# Patient Record
Sex: Male | Born: 1970 | Race: Black or African American | Hispanic: No | Marital: Single | Smoking: Never smoker
Health system: Southern US, Community
[De-identification: ages and names within clinical notes are randomized; demographics above are authoritative.]

## PROBLEM LIST (undated history)

## (undated) DIAGNOSIS — E7800 Pure hypercholesterolemia, unspecified: Secondary | ICD-10-CM

---

## 2014-06-20 ENCOUNTER — Encounter (HOSPITAL_COMMUNITY): Payer: Self-pay | Admitting: Emergency Medicine

## 2014-06-20 ENCOUNTER — Emergency Department (HOSPITAL_COMMUNITY): Payer: Self-pay

## 2014-06-20 ENCOUNTER — Emergency Department (HOSPITAL_COMMUNITY)
Admission: EM | Admit: 2014-06-20 | Discharge: 2014-06-20 | Disposition: A | Discharge status: Home / Self Care | Payer: Self-pay | Attending: Emergency Medicine | Admitting: Emergency Medicine

## 2014-06-20 DIAGNOSIS — R0789 Other chest pain: Secondary | ICD-10-CM | POA: Insufficient documentation

## 2014-06-20 DIAGNOSIS — Z79899 Other long term (current) drug therapy: Secondary | ICD-10-CM | POA: Insufficient documentation

## 2014-06-20 DIAGNOSIS — E7800 Pure hypercholesterolemia, unspecified: Secondary | ICD-10-CM | POA: Insufficient documentation

## 2014-06-20 HISTORY — DX: Pure hypercholesterolemia, unspecified: E78.00

## 2014-06-20 LAB — BASIC METABOLIC PANEL
BUN: 9 mg/dL (ref 6–23)
CO2: 29 mEq/L (ref 19–32)
Calcium: 9.5 mg/dL (ref 8.4–10.5)
Chloride: 101 mEq/L (ref 96–112)
Creatinine, Ser: 0.96 mg/dL (ref 0.50–1.35)
GFR calc Af Amer: >90 mL/min (ref >90)
GFR calc non Af Amer: >90 mL/min (ref >90)
Glucose, Bld: 93 mg/dL (ref 70–99)
Potassium: 4.2 mEq/L (ref 3.7–5.3)
Sodium: 139 mEq/L (ref 137–147)

## 2014-06-20 LAB — CBC
HCT: 42.6 % (ref 39.0–52.0)
Hemoglobin: 15 g/dL (ref 13.0–17.0)
MCH: 31.4 pg (ref 26.0–34.0)
MCHC: 35.2 g/dL (ref 30.0–36.0)
MCV: 89.3 fL (ref 78.0–100.0)
Platelets: 250 10*3/uL (ref 150–400)
RBC: 4.77 MIL/uL (ref 4.22–5.81)
RDW: 12.6 % (ref 11.5–15.5)
WBC: 5.4 10*3/uL (ref 4.0–10.5)

## 2014-06-20 LAB — I-STAT TROPONIN, ED: Troponin i, poc: 0 ng/mL (ref 0.00–0.08)

## 2014-06-20 MED ORDER — IBUPROFEN 800 MG PO TABS: 800.0000 mg | ORAL_TABLET | Freq: Three times a day (TID) | ORAL | Status: AC | PRN

## 2014-06-20 NOTE — Discharge Instructions (Signed)
Followup with Felicia about obtaining resources for followup.  Return here as needed

## 2014-06-20 NOTE — Discharge Planning (Signed)
[facility] Community Liaison  Spoke to patient about follow up care and establishing care with a provider. Patient states he is looking to go full time with his current employer and obtain insurance through the company. Patient was given the orange card application and primary care resource guide. My contact information was also provided for any future questions or concerns.
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
## 2014-06-20 NOTE — ED Notes (Signed)
Pt reports mid chest tightness x 5 days. Had mild cough and sob recently. No acute distress noted, ekg done at triage.

## 2014-06-22 NOTE — ED Provider Notes (Signed)
Medical screening examination/treatment/procedure(s) were performed by non-physician practitioner and as supervising physician I was immediately available for consultation/collaboration.   EKG Interpretation   Date/Time:  Wednesday June 20 2014 08:13:30 EDT Ventricular Rate:  59 PR Interval:  146 QRS Duration: 84 QT Interval:  414 QTC Calculation: 409 R Axis:   49 Text Interpretation:  Sinus bradycardia Otherwise normal ECG No old  tracing to compare Confirmed by [facility]  MD, Nicholos JohnsKATHLEEN [phone]([id number]) on  06/20/2014 8:38:32 AM        Laray AngerKathleen M McManus, DO 06/22/14 [phone]

## 2015-12-26 ENCOUNTER — Emergency Department (HOSPITAL_COMMUNITY)
Admission: EM | Admit: 2015-12-26 | Discharge: 2015-12-26 | Disposition: A | Discharge status: Home / Self Care | Payer: 59 | Attending: Physician Assistant | Admitting: Physician Assistant

## 2015-12-26 ENCOUNTER — Encounter (HOSPITAL_COMMUNITY): Payer: Self-pay | Admitting: Emergency Medicine

## 2015-12-26 DIAGNOSIS — L299 Pruritus, unspecified: Secondary | ICD-10-CM | POA: Insufficient documentation

## 2015-12-26 DIAGNOSIS — Z8639 Personal history of other endocrine, nutritional and metabolic disease: Secondary | ICD-10-CM | POA: Insufficient documentation

## 2015-12-26 MED ORDER — PREDNISONE 20 MG PO TABS
60.0000 mg | ORAL_TABLET | Freq: Once | ORAL | Status: AC
  Administered 2015-12-26: 60 mg via ORAL
  Filled 2015-12-26: qty 3

## 2015-12-26 MED ORDER — PREDNISONE 10 MG (21) PO TBPK: 10.0000 mg | ORAL_TABLET | Freq: Every day | ORAL | Status: AC

## 2015-12-26 NOTE — Discharge Instructions (Signed)
[name],  Nice meeting you! Please follow-up with your primary care provider or a dermatologist as needed. Return to the emergency department if you develop fevers, chills, shortness of breath, tongue itching/numbness. Feel better soon!  S. Lane Hacker, PA-C   Pruritus Pruritus is an itching feeling. There are many different conditions and factors that can make your skin itchy. Dry skin is one of the most common causes of itching. Most cases of itching do not require medical attention. Itchy skin can turn into a rash.  HOME CARE INSTRUCTIONS  Watch your pruritus for any changes. Take these steps to help with your condition:  Skin Care  Moisturize your skin as needed. A moisturizer that contains petroleum jelly is best for keeping moisture in your skin.  Take or apply medicines only as directed by your health care provider. This may include:  Corticosteroid cream.  Anti-itch lotions.  Oral anti-histamines.  Apply cool compresses to the affected areas.  Try taking a bath with:  Epsom salts. Follow the instructions on the packaging. You can get these at your local pharmacy or grocery store.  Baking soda. Pour a small amount into the bath as directed by your health care provider.  Colloidal oatmeal. Follow the instructions on the packaging. You can get this at your local pharmacy or grocery store.  Try applying baking soda paste to your skin. Stir water into baking soda until it reaches a paste-like consistency.   Do not scratch your skin.  Avoid hot showers or baths, which can make itching worse. A cold shower may help with itching as long as you use a moisturizer after.  Avoid scented soaps, detergents, and perfumes. Use gentle soaps, detergents, perfumes, and other cosmetic products. General Instructions  Avoid wearing tight clothes.  Keep a journal to help track what causes your itch. Write down:  What you eat.  What cosmetic products you use.  What you  drink.  What you wear. This includes jewelry.  Use a humidifier. This keeps the air moist, which helps to prevent dry skin. SEEK MEDICAL CARE IF:  The itching does not go away after several days.  You sweat at night.  You have weight loss.  You are unusually thirsty.  You urinate more than normal.  You are more tired than normal.  You have abdominal pain.  Your skin tingles.  You feel weak.  Your skin or the whites of your eyes look yellow (jaundice).  Your skin feels numb.   This information is not intended to replace advice given to you by your health care provider. Make sure you discuss any questions you have with your health care provider.   Document Released: 08/26/2011 Document Revised: 04/30/2015 Document Reviewed: 12/10/2014 Elsevier Interactive Patient Education 2016 [employer].    Emergency Department Resource Guide 1) Find a Doctor and Pay Out of Pocket Although you won't have to find out who is covered by your insurance plan, it is a good idea to ask around and get recommendations. You will then need to call the office and see if the doctor you have chosen will accept you as a new patient and what types of options they offer for patients who are self-pay. Some doctors offer discounts or will set up payment plans for their patients who do not have insurance, but you will need to ask so you aren't surprised when you get to your appointment.  2) Contact Your Local Health Department Not all health departments have doctors that can  see patients for sick visits, but many do, so it is worth a call to see if yours does. If you don't know where your local health department is, you can check in your phone book. The CDC also has a tool to help you locate your state's health department, and many state websites also have listings of all of their local health departments.  3) Find a Walk-in Clinic If your illness is not likely to be very severe or complicated, you may  want to try a walk in clinic. These are popping up all over the country in pharmacies, drugstores, and shopping centers. They're usually staffed by nurse practitioners or physician assistants that have been trained to treat common illnesses and complaints. They're usually fairly quick and inexpensive. However, if you have serious medical issues or chronic medical problems, these are probably not your best option.  No Primary Care Doctor: - Call Health Connect at  [phone] - they can help you locate a primary care doctor that  accepts your insurance, provides certain services, etc. - Physician Referral Service- [phone]  Chronic Pain Problems: Organization         Address  Phone   Notes  [facility]  [phone] Patients need to be referred by their primary care doctor.   Medication Assistance: Organization         Address  Phone   Notes  [facility] Med[facility] [address] [place] [place], [place] [address] [phone] --Must be a resident of [facility] -- Must have NO insurance coverage whatsoever (no Medicaid/ Medicare, etc.) -- The pt. MUST have a primary care doctor that directs their care regularly and follows them in the community   MedAssist  [phone]   [employer]  [phone]    Agencies that provide inexpensive medical care: Organization         Address  Phone   Notes  [facility]  [phone]   [facility]    [phone]   [facility] [address] [place], [place] [address] [phone]   [facility] [address] [place], [place] [phone]   Planned Parenthood    [phone]   [facility]    [phone]   [facility]  [address], [place] Phone:  [phone], Fax:  [phone] Hours of Operation:  9 am - 6 pm, M-F.  Also accepts Medicaid/Medicare and self-pay.   [facility]  [address], [place] Phone: [phone], Fax: [phone]. Hours of Operation:  8:30 am - 5:30 pm, M-F.  Also accepts Medicaid and self-pay.  [facility] [place] [address], [place] Phone: [phone]   [facility] [address] Natasha Bence [place], [place] [phone], Ext. [phone] Mondays & Thursdays: 7-9 AM.  First 15 patients are seen on a first come, first serve basis.    Medicaid-accepting [facility] Providers:  Organization         Address  Phone   Notes  [facility] [address],  [phone] Also accepts self-pay patients.  [facility] [address] Laurell Josephs [place], [place]  [phone]   [facility] [address], [place] [phone]   [facility] [address] [place] [address],  [place] [phone]   Renaye Rakers [address], [place]   [phone] Only accepts [place] Access [place] patients after they have their name applied to their card.   Self-Pay (no insurance) in [facility]:  Organization         Address  Phone   Notes  Sickle Cell Patients, [facility] [address] [place], [place] [phone]   [facility] [address] [place], [place] [phone]   [facility] [place]  [address], [place] [phone]   [facility]/Dr. Osei-Bonsu  [address] [place] or [address], [place] [phone] Phone number for both [place] and [place] locations is the same.  [facility] [address] [place] [phone]   [facility] [address] [place] or [address] [phone]   [facility] [address], [place] [phone], phone; [phone], fax Sees patients 1st and 3rd Saturday of every month.  Must not qualify for public or private insurance (i.e. Medicaid, Medicare, [place] Health Choice, Veterans' Benefits)  Household income should be no more than 200% of the poverty level The clinic cannot treat you if you are pregnant or think you are pregnant  Sexually transmitted diseases are not treated at the clinic.    Dental Care: Organization         Address  Phone  Notes  [facility] [address] [place], [place] [phone] Accepts children up to age 67 who are enrolled in [place] or [place] Health Choice; pregnant women with a Medicaid card; and children who have applied for Medicaid or [place] Health Choice, but were declined, whose parents can pay a reduced fee at time of service.  [facility] of [facility]  [address], [place] [phone] Accepts children up to age 58 who are enrolled in [place] or [place] Health Choice; pregnant women with a Medicaid card; and children who have applied for Medicaid or [place] Health Choice, but were declined, whose parents can pay a reduced fee at time of service.  [facility]  [address] [place], [place] [phone] Patients are seen by appointment only. Walk-ins are not accepted. [facility] will see patients 56 years of age and older. Monday - Tuesday (8am-5pm) Most Wednesdays (8:30-5pm) $30 per visit, cash only  [facility]  [address], [facility] [phone] Patients are seen by appointment only. Walk-ins are not accepted. [facility] will see patients 24 years of age and older. One Wednesday Evening (Monthly: Volunteer Based).  $30 per visit, cash only  [employer]  [phone] for adults; Children under age 24, call Graduate Pediatric Dentistry at [phone]. Children aged 79-14, please call [phone] to request a pediatric application.  Dental services are provided in all areas of dental care including fillings, crowns and bridges, complete and partial dentures, implants, gum treatment, root canals, and extractions. Preventive care is also provided. Treatment is provided to both adults and children. Patients are selected via a lottery and there is often a waiting list.   [facility] [address] [place]  [phone] [email or web address]   [facility] [address],  [place], [place] [phone], Ext. [phone] Second and Fourth Thursday of each month, opens at 6:30 AM; Clinic ends at 9 AM.  Patients are seen on a first-come first-served basis, and a limited number are seen during each clinic.   [facility]  [address] Ether Griffins [place], [place] [phone]   Eligibility Requirements You must have lived in [place], [place], or [place] counties for at least the last three months.   You cannot be eligible for state or federal sponsored [place], including [employer], [place], or [employer].   You generally cannot be eligible for healthcare insurance through your employer.    How to apply: Eligibility screenings are held every Tuesday and Wednesday afternoon from 1:00 pm until 4:00 pm. You do not need an appointment for the interview!  [facility] [address] [place], [place] [phone]   [facility] Health Department  [phone]   [facility] Health Department  [phone]   [facility] Health Department  [phone]    Behavioral Health Resources in the Community: Intensive Outpatient Programs Organization         Address  Phone  Notes  [facility] [address] [place], [place] [phone]   [facility] Outpatient [address], [place], [place] [phone]   [facility] [address] [place], [place]  [phone]    [facility] [address]  [place], [place] [phone] or [phone]   Substance Abuse Resources Organization         Address  Phone  Notes  [facility]  [phone]   [facility]  [phone]   [facility] [place]  [phone]   Floydene Flock  [phone]   Residential & Outpatient Substance Abuse Program  [phone]   Psychological Services Organization         Address  Phone  Notes  [facility] Behavioral Health  [phone]   [facility]  [phone]   [facility] [address] [place] [phone] or [phone]    Mobile Crisis Teams Organization         Address  Phone  Notes  [facility], Mobile Crisis Care Unit  [phone]   [facility]  [address] [place], [place] [phone]   Doristine Locks [address] [place] [phone]    Self-Help/Support Groups Organization         Address  Phone             Notes  [facility] [place] - variety of support groups  [phone]- I7437963 Call for more information  Narcotics Anonymous (NA), [facility] [address], [employer] [place]  2 meetings at this location   Statistician         Address  Phone  Notes  [facility] [address],    [place]  [phone]   [facility]  [address], [place] [address], [place], [place] [phone]   [facility] [address] [place], [place] [phone] Admissions: 8am-3pm M-F  [facility] [address]    [place], [place] [phone]   [facility] [address] Starling [place], [place] [phone]   [facility] [address],  [place], [place] [phone]   [facility] - Intensive Outpatient [address]ure[address] Josephs [address], [place], [place] [phone]  [facility] ([facility] [address]  [place], [place] [phone] or [phone]   [facility] ([facility]) [address] [place], [place] [phone] Accepts Medicaid  [facility] [address]  [place] [phone] Substance Abuse/Addiction Treatment   [facility] Behavioral Health Resources Organization         Address  Phone  Notes  [facility]  [phone](888) [phone]   Angie FavaJulie Brannon, PhD [address] [place], [place]   [phone]) [phone] or [phone]) [phone]   [facility]   [address] [place], [place] [phone](336) [phone]   [facility] [address], [place], [place] [phone]) [phone] Insurance/Medicaid/sponsorship through [facility]  [facility] [address]                                    [place], [place] [phone]) [phone] Therapy/tele-psych/case  [facility] [address]   [place], [place] [phone](336) [phone]    Dr. Lolly MustacheArfeen  [phone]) [phone]   [facility]  [employer] [facility] 1) [address], [place] 2) [address], [place] 3)  [address], [place] [phone]) [phone]) [phone]  [phone](336) [phone]   [facility] Child Abuse Hotline [phone]) [phone] or [phone]) [phone] (After Hours)

## 2015-12-26 NOTE — ED Provider Notes (Signed)
CSN: [id number]     Arrival date & time 12/26/15  2121 History  By signing my name below, I, Tanda RockersMargaux Venter, attest that this documentation has been prepared under the direction and in the presence of Lane HackerNicole Honestee Revard, PA-C. Electronically Signed: Tanda RockersMargaux Venter, ED Scribe. 12/26/2015. 9:47 PM.   Chief Complaint: Pruritis  The history is provided by the patient. No language interpreter was used.    HPI Comments: Daleen SnookKevin Borak is a 44 y.o. male who presents to the Emergency Department complaining of gradual onset, constant, diffuse itching sensation x 4 days. He states that the the itching began on his bilateral forearms and spread. Pt denies new known exposure to soaps, detergents, or linens but reports he visited someone prior to onset and may have come into contact with a different hygienic product without knowing it. The itching is worse during certain times of the day but pt cannot specify when it is worse. The itching is mildly alleviated after he has showered and applied lotion to his body. He has tried Hydrocortisone cream and Benadryl without relief. He also complains of a mild headache (intermittent, not in pain currently, frontal in location, non-radiating). Denies shortness of breath, trouble swallowing, abdominal pain, nausea, vomiting, night sweats, rash, or any other associated symptoms.   Past Medical History  Diagnosis Date  . High cholesterol    History reviewed. No pertinent past surgical history. No family history on file. Social History  Substance Use Topics  . Smoking status: Never Smoker   . Smokeless tobacco: None  . Alcohol Use: No    Review of Systems  Constitutional: Negative for diaphoresis.  HENT: Negative for trouble swallowing.   Respiratory: Negative for shortness of breath.   Gastrointestinal: Negative for nausea and vomiting.  Skin: Negative for rash.       + Itching sensation to diffuse body  Neurological: Positive for headaches.  All other systems  reviewed and are negative.  Allergies  Review of patient's allergies indicates no known allergies.  Home Medications   Prior to Admission medications   Medication Sig Start Date End Date Taking? Authorizing Provider  Fish Oil-Cholecalciferol (FISH OIL + D3 PO) Take 1 capsule by mouth daily.    Historical Provider, MD  ibuprofen (ADVIL,MOTRIN) 800 MG tablet Take 1 tablet (800 mg total) by mouth every 8 (eight) hours as needed. 06/20/14   Charlestine Nighthristopher Lawyer, PA-C  Multiple Vitamin (MULTIVITAMIN WITH MINERALS) TABS tablet Take 1 tablet by mouth daily.    Historical Provider, MD  OVER THE COUNTER MEDICATION Take 500 mg by mouth 2 (two) times daily. L-Arginine    Historical Provider, MD   Triage Vitals: BP 111/76 mmHg  Pulse 67  Temp(Src) 98 F (36.7 C) (Oral)  Resp 14  Ht 5\' 9"  (1.753 m)  Wt 190 lb (86.183 kg)  BMI 28.05 kg/m2  SpO2 97%   Physical Exam  Constitutional: He is oriented to person, place, and time. He appears well-developed and well-nourished. No distress.  HENT:  Head: Normocephalic and atraumatic.  Right Ear: External ear normal.  Left Ear: External ear normal.  Nose: Nose normal.  Mouth/Throat: Oropharynx is clear and moist. No oropharyngeal exudate.  Airway intact. No angioedema.  Eyes: Conjunctivae and EOM are normal. Pupils are equal, round, and reactive to light. Right eye exhibits no discharge. Left eye exhibits no discharge. No scleral icterus.  Neck: Neck supple. No tracheal deviation present.  Cardiovascular: Normal rate, regular rhythm, normal heart sounds and intact distal pulses.  Exam reveals  no gallop and no friction rub.   No murmur heard. Pulmonary/Chest: Effort normal and breath sounds normal. No respiratory distress. He has no wheezes. He has no rales. He exhibits no tenderness.  Abdominal: Soft. Bowel sounds are normal. He exhibits no distension and no mass. There is no tenderness. There is no rebound and no guarding.  Musculoskeletal: Normal range  of motion. He exhibits no edema.  Lymphadenopathy:    He has no cervical adenopathy.  Neurological: He is alert and oriented to person, place, and time. Coordination normal.  Skin: Skin is warm and dry. No rash noted. He is not diaphoretic. No erythema.  No rashes or skin lesions.  Psychiatric: He has a normal mood and affect. His behavior is normal.  Nursing note and vitals reviewed.   ED Course  Procedures   DIAGNOSTIC STUDIES: Oxygen Saturation is 97% on RA, normal by my interpretation.    COORDINATION OF CARE: 9:41 PM-Discussed treatment plan which includes referral to PCP and dermatologist with pt at bedside and pt agreed to plan.   MDM   Final diagnoses:  Itching   Patient non-toxic appearing and VSS. No evidence of rashes or skin lesions. No concern for anaphylaxis. Will treat symptomatically with steroid taper. Patient may be safely discharged home. Discussed reasons for return. Patient to follow-up with primary care provider within one week. Patient in understanding and agreement with the plan.   Melton Krebs, PA-C 01/05/16 1515  Courteney Randall An, MD 01/08/16 1504

## 2015-12-26 NOTE — ED Notes (Signed)
Pt. reports generalized skin itching for 4 days , no visible rashes or skin lesions , pt. stated he tried using OTC Hydrocortizone cream and Benadryl with no relief. Airway intact / respirations unlabored.
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
## 2021-11-07 ENCOUNTER — Encounter: Payer: Self-pay | Admitting: Internal Medicine
